# Patient Record
Sex: Male | Born: 2000 | Race: White | Hispanic: No | Marital: Single | Smoking: Never smoker
Health system: Southern US, Community
[De-identification: ages and names within clinical notes are randomized; demographics above are authoritative.]

## PROBLEM LIST (undated history)

## (undated) DIAGNOSIS — F419 Anxiety disorder, unspecified: Secondary | ICD-10-CM

---
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
## 2020-09-10 ENCOUNTER — Encounter: Payer: Self-pay | Admitting: *Deleted

## 2020-09-10 ENCOUNTER — Emergency Department
Admission: EM | Admit: 2020-09-10 | Discharge: 2020-09-10 | Disposition: A | Discharge status: Home / Self Care | Payer: Federal, State, Local not specified - PPO | Attending: Emergency Medicine | Admitting: Emergency Medicine

## 2020-09-10 ENCOUNTER — Emergency Department: Payer: Federal, State, Local not specified - PPO

## 2020-09-10 DIAGNOSIS — R0789 Other chest pain: Secondary | ICD-10-CM | POA: Insufficient documentation

## 2020-09-10 DIAGNOSIS — F419 Anxiety disorder, unspecified: Secondary | ICD-10-CM | POA: Insufficient documentation

## 2020-09-10 DIAGNOSIS — F429 Obsessive-compulsive disorder, unspecified: Secondary | ICD-10-CM | POA: Diagnosis not present

## 2020-09-10 DIAGNOSIS — R079 Chest pain, unspecified: Secondary | ICD-10-CM | POA: Diagnosis present

## 2020-09-10 LAB — COMPREHENSIVE METABOLIC PANEL
ALT: 44 U/L (ref 0–44)
AST: 28 U/L (ref 15–41)
Albumin: 4.4 g/dL (ref 3.5–5.0)
Alkaline Phosphatase: 57 U/L (ref 38–126)
Anion gap: 11 (ref 5–15)
BUN: 19 mg/dL (ref 6–20)
CO2: 26 mmol/L (ref 22–32)
Calcium: 9.9 mg/dL (ref 8.9–10.3)
Chloride: 102 mmol/L (ref 98–111)
Creatinine, Ser: 0.76 mg/dL (ref 0.61–1.24)
GFR, Estimated: >60 mL/min (ref >60)
Glucose, Bld: 87 mg/dL (ref 70–99)
Potassium: 3.9 mmol/L (ref 3.5–5.1)
Sodium: 139 mmol/L (ref 135–145)
Total Bilirubin: 0.9 mg/dL (ref 0.3–1.2)
Total Protein: 7.7 g/dL (ref 6.5–8.1)

## 2020-09-10 LAB — CBC WITH DIFFERENTIAL/PLATELET
Abs Immature Granulocytes: 0.02 10*3/uL (ref 0.00–0.07)
Basophils Absolute: 0 10*3/uL (ref 0.0–0.1)
Basophils Relative: 0 %
Eosinophils Absolute: 0.5 10*3/uL (ref 0.0–0.5)
Eosinophils Relative: 5 %
HCT: 46.6 % (ref 39.0–52.0)
Hemoglobin: 15.3 g/dL (ref 13.0–17.0)
Immature Granulocytes: 0 %
Lymphocytes Relative: 32 %
Lymphs Abs: 3.2 10*3/uL (ref 0.7–4.0)
MCH: 28.7 pg (ref 26.0–34.0)
MCHC: 32.8 g/dL (ref 30.0–36.0)
MCV: 87.3 fL (ref 80.0–100.0)
Monocytes Absolute: 0.5 10*3/uL (ref 0.1–1.0)
Monocytes Relative: 5 %
Neutro Abs: 5.6 10*3/uL (ref 1.7–7.7)
Neutrophils Relative %: 58 %
Platelets: 258 10*3/uL (ref 150–400)
RBC: 5.34 MIL/uL (ref 4.22–5.81)
RDW: 12.4 % (ref 11.5–15.5)
WBC: 9.8 10*3/uL (ref 4.0–10.5)
nRBC: 0 % (ref 0.0–0.2)

## 2020-09-10 LAB — TROPONIN I (HIGH SENSITIVITY): Troponin I (High Sensitivity): 4 ng/L (ref <18)

## 2020-09-10 LAB — TSH: TSH: 1.855 u[IU]/mL (ref 0.350–4.500)

## 2020-09-10 MED ORDER — ALPRAZOLAM 0.5 MG PO TABS: 0.5000 mg | ORAL_TABLET | Freq: Three times a day (TID) | ORAL | 0 refills | Status: AC | PRN

## 2020-09-10 MED ORDER — FLUOXETINE HCL 10 MG PO CAPS
10.0000 mg | ORAL_CAPSULE | Freq: Every day | ORAL | 2 refills | Status: AC
Start: 2020-09-10 — End: 2021-09-10

## 2020-09-10 NOTE — ED Triage Notes (Signed)
Patient states that he has been experiencing left sided chest pain during stressful events with sweating and winded/vertigo sensation.   Started 2 weeks ago, states has been happening intermittently. Once a day for last week.

## 2020-09-10 NOTE — ED Provider Notes (Signed)
[facility] Emergency Department Provider Note  ____________________________________________   Event Date/Time   First MD Initiated Contact with Patient 09/10/20 1836     (approximate)  I have reviewed the triage vital signs and the nursing notes.   HISTORY  Chief Complaint Anxiety and Chest Pain    HPI Hayden Peters is a 20 y.o. male presents emergency department complaining of anxiety and chest pain for 2 weeks.  Patient states that happens intermittently and radiates down the left arm.  We will also become dizzy during these episodes.  No history of heart disease.  Non-smoker.  No history of COVID.  No COVID vaccines.  Patient's mother states that she has anxiety and seems to be the same type symptoms.  She states that his grandmother had MIs in her mid 30s.  He has never had high cholesterol per the mother.    History reviewed. No pertinent past medical history.  There are no problems to display for this patient.   History reviewed. No pertinent surgical history.  Prior to Admission medications   Medication Sig Start Date End Date Taking? Authorizing Provider  ALPRAZolam Prudy Feeler) 0.5 MG tablet Take 1 tablet (0.5 mg total) by mouth 3 (three) times daily as needed for sleep or anxiety. 09/10/20 09/10/21 Yes Vora Clover, Roselyn Bering, PA-C  FLUoxetine (PROZAC) 10 MG capsule Take 1 capsule (10 mg total) by mouth daily. 09/10/20 09/10/21 Yes Symia Herdt, Roselyn Bering, PA-C    Allergies Sulfa antibiotics  History reviewed. No pertinent family history.  Social History Social History   Tobacco Use  . Smoking status: Never Smoker  . Smokeless tobacco: Never Used  Substance Use Topics  . Alcohol use: No  . Drug use: Never    Review of Systems  Constitutional: No fever/chills Eyes: No visual changes. ENT: No sore throat. Respiratory: Denies cough Cardiovascular: Plus chest pain Gastrointestinal: Denies abdominal pain Genitourinary: Negative for  dysuria. Musculoskeletal: Negative for back pain. Skin: Negative for rash. Psychiatric: no mood changes,     ____________________________________________   PHYSICAL EXAM:  VITAL SIGNS: ED Triage Vitals [09/10/20 1542]  Enc Vitals Group     BP      Pulse      Resp      Temp      Temp src      SpO2      Weight      Height      Head Circumference      Peak Flow      Pain Score 2     Pain Loc      Pain Edu?      Excl. in GC?     Constitutional: Alert and oriented. Well appearing and in no acute distress. Eyes: Conjunctivae are normal.  Head: Atraumatic. Nose: No congestion/rhinnorhea. Mouth/Throat: Mucous membranes are moist.  Neck:  supple no lymphadenopathy noted Cardiovascular: Normal rate, regular rhythm. Heart sounds are normal Respiratory: Normal respiratory effort.  No retractions, lungs c t a  GU: deferred Musculoskeletal: FROM all extremities, warm and well perfused Neurologic:  Normal speech and language.  Skin:  Skin is warm, dry and intact. No rash noted. Psychiatric: Mood and affect are normal. Speech and behavior are normal.  ____________________________________________   LABS (all labs ordered are listed, but only abnormal results are displayed)  Labs Reviewed  COMPREHENSIVE METABOLIC PANEL  CBC WITH DIFFERENTIAL/PLATELET  TSH  TROPONIN I (HIGH SENSITIVITY)   ____________________________________________   ____________________________________________  RADIOLOGY  Chest x-ray  ____________________________________________  PROCEDURES  Procedure(s) performed: No  Procedures    ____________________________________________   INITIAL IMPRESSION / ASSESSMENT AND PLAN / ED COURSE  Pertinent labs & imaging results that were available during my care of the patient were reviewed by me and considered in my medical decision making (see chart for details).   The patient is 20 year old male presents with concerns of anxiety versus heart  disease.  See HPI.  Physical exam shows patient to appear stable.  Vitals normal.  Due to the presentation however I do feel that we need to evaluate heart enzymes.  DDx: Myocarditis, angina, anxiety  CBC, metabolic panel, troponin EKG shows sinus arrhythmia, see physician read  Chest x-ray   Chest x-ray is normal, labs are normal and reassuring.  I did explain everything to the mother and the patient.  I do feel that with the sinus arrhythmia that is actually noticeable upon auscultation that he should follow-up with cardiology to have a Holter monitor.  He should also follow-up with psychiatry for the OCD/anxiety.  I did start him on Prozac 10 mg daily.  He was also given Xanax for as needed use for panic attack.  His mother states they will make appointments.  Follow-up as instructed.  He was also given strict cautions to return if worsening.  If he begins to feel suicidal or feels that he is worsening with the medication he should stop it immediately and come to the emergency department or his regular doctor.  Hayden Peters was evaluated in Emergency Department on 09/10/2020 for the symptoms described in the history of present illness. He was evaluated in the context of the global COVID-19 pandemic, which necessitated consideration that the patient might be at risk for infection with the SARS-CoV-2 virus that causes COVID-19. Institutional protocols and algorithms that pertain to the evaluation of patients at risk for COVID-19 are in a state of rapid change based on information released by regulatory bodies including the CDC and federal and state organizations. These policies and algorithms were followed during the patient's care in the ED.    As part of my medical decision making, I reviewed the following data within the electronic MEDICAL RECORD NUMBER History obtained from family, Nursing notes reviewed and incorporated, Labs reviewed , EKG interpreted see physician read, Old chart reviewed,  Radiograph reviewed , Notes from prior ED visits and [place] Controlled Substance Database  ____________________________________________   FINAL CLINICAL IMPRESSION(S) / ED DIAGNOSES  Final diagnoses:  Anxiety  Obsessive-compulsive disorder, unspecified type  Atypical chest pain      NEW MEDICATIONS STARTED DURING THIS VISIT:  New Prescriptions   ALPRAZOLAM (XANAX) 0.5 MG TABLET    Take 1 tablet (0.5 mg total) by mouth 3 (three) times daily as needed for sleep or anxiety.   FLUOXETINE (PROZAC) 10 MG CAPSULE    Take 1 capsule (10 mg total) by mouth daily.     Note:  This document was prepared using Dragon voice recognition software and may include unintentional dictation errors.    Faythe Ghee, PA-C 09/10/20 2025    Delton [place], MD 09/10/20 2055

## 2021-05-27 ENCOUNTER — Emergency Department: Payer: Federal, State, Local not specified - PPO

## 2021-05-27 ENCOUNTER — Encounter: Payer: Self-pay | Admitting: Emergency Medicine

## 2021-05-27 ENCOUNTER — Other Ambulatory Visit: Payer: Self-pay

## 2021-05-27 ENCOUNTER — Emergency Department
Admission: EM | Admit: 2021-05-27 | Discharge: 2021-05-27 | Disposition: A | Discharge status: Home / Self Care | Payer: Federal, State, Local not specified - PPO | Attending: Emergency Medicine | Admitting: Emergency Medicine

## 2021-05-27 DIAGNOSIS — R2241 Localized swelling, mass and lump, right lower limb: Secondary | ICD-10-CM | POA: Insufficient documentation

## 2021-05-27 HISTORY — DX: Anxiety disorder, unspecified: F41.9

## 2021-05-27 LAB — CBC WITH DIFFERENTIAL/PLATELET
Abs Immature Granulocytes: 0.02 10*3/uL (ref 0.00–0.07)
Basophils Absolute: 0 10*3/uL (ref 0.0–0.1)
Basophils Relative: 0 %
Eosinophils Absolute: 0.5 10*3/uL (ref 0.0–0.5)
Eosinophils Relative: 6 %
HCT: 47 % (ref 39.0–52.0)
Hemoglobin: 16.2 g/dL (ref 13.0–17.0)
Immature Granulocytes: 0 %
Lymphocytes Relative: 34 %
Lymphs Abs: 3.2 10*3/uL (ref 0.7–4.0)
MCH: 29.7 pg (ref 26.0–34.0)
MCHC: 34.5 g/dL (ref 30.0–36.0)
MCV: 86.1 fL (ref 80.0–100.0)
Monocytes Absolute: 0.6 10*3/uL (ref 0.1–1.0)
Monocytes Relative: 6 %
Neutro Abs: 5 10*3/uL (ref 1.7–7.7)
Neutrophils Relative %: 54 %
Platelets: 247 10*3/uL (ref 150–400)
RBC: 5.46 MIL/uL (ref 4.22–5.81)
RDW: 12.1 % (ref 11.5–15.5)
WBC: 9.3 10*3/uL (ref 4.0–10.5)
nRBC: 0 % (ref 0.0–0.2)

## 2021-05-27 LAB — COMPREHENSIVE METABOLIC PANEL
ALT: 58 U/L — ABNORMAL HIGH (ref 0–44)
AST: 30 U/L (ref 15–41)
Albumin: 4.3 g/dL (ref 3.5–5.0)
Alkaline Phosphatase: 59 U/L (ref 38–126)
Anion gap: 8 (ref 5–15)
BUN: 16 mg/dL (ref 6–20)
CO2: 29 mmol/L (ref 22–32)
Calcium: 9.7 mg/dL (ref 8.9–10.3)
Chloride: 99 mmol/L (ref 98–111)
Creatinine, Ser: 0.93 mg/dL (ref 0.61–1.24)
GFR, Estimated: >60 mL/min (ref >60)
Glucose, Bld: 93 mg/dL (ref 70–99)
Potassium: 4 mmol/L (ref 3.5–5.1)
Sodium: 136 mmol/L (ref 135–145)
Total Bilirubin: 0.9 mg/dL (ref 0.3–1.2)
Total Protein: 7.3 g/dL (ref 6.5–8.1)

## 2021-05-27 NOTE — ED Provider Notes (Signed)
[facility]-EMERGENCY DEPARTMENT  ____________________________________________  Time seen: Approximately 7:16 PM  I have reviewed the triage vital signs and the nursing notes.   HISTORY  Chief Complaint Leg Pain   Historian Patient     HPI Hayden Peters is a 20 y.o. male presents to the emergency department with concern for a soft tissue mass along the posterior aspect of the right thigh approximately 4 cm x 3 cm.  Patient reports that he first noticed mass when he went to sit down on the couch tonight.  He reports that area is tender to the touch, soft and movable.  No overlying erythema or fever at home.  He denies falls or mechanisms of trauma.  No new physical activity.  No weight loss, weight gain, night sweats or bony pain.  No personal history of malignancy.   Past Medical History:  Diagnosis Date   Anxiety      Immunizations up to date:  Yes.     Past Medical History:  Diagnosis Date   Anxiety     There are no problems to display for this patient.   No past surgical history on file.  Prior to Admission medications   Medication Sig Start Date End Date Taking? Authorizing Provider  ALPRAZolam Prudy Feeler) 0.5 MG tablet Take 1 tablet (0.5 mg total) by mouth 3 (three) times daily as needed for sleep or anxiety. 09/10/20 09/10/21  Fisher, Roselyn Bering, PA-C  FLUoxetine (PROZAC) 10 MG capsule Take 1 capsule (10 mg total) by mouth daily. 09/10/20 09/10/21  Faythe Ghee, PA-C    Allergies Sulfa antibiotics  No family history on file.  Social History Social History   Tobacco Use   Smoking status: Never   Smokeless tobacco: Never  Vaping Use   Vaping Use: Never used  Substance Use Topics   Alcohol use: No   Drug use: Never     Review of Systems  Constitutional: No fever/chills Eyes:  No discharge ENT: No upper respiratory complaints. Respiratory: no cough. No SOB/ use of accessory muscles to breath Gastrointestinal:   No nausea, no vomiting.  No diarrhea.  No  constipation. Musculoskeletal: Patient has right posterior thigh pain.  Skin: Negative for rash, abrasions, lacerations, ecchymosis.  ____________________________________________   PHYSICAL EXAM:  VITAL SIGNS: ED Triage Vitals  Enc Vitals Group     BP 05/27/21 1810 137/89     Pulse Rate 05/27/21 1810 82     Resp 05/27/21 1810 16     Temp 05/27/21 1810 98.5 F (36.9 C)     Temp Source 05/27/21 1810 Oral     SpO2 05/27/21 1810 96 %     Weight 05/27/21 1811 269 lb (122 kg)     Height 05/27/21 1811 6\' 1"  (1.854 m)     Head Circumference --      Peak Flow --      Pain Score 05/27/21 1811 2     Pain Loc --      Pain Edu? --      Excl. in GC? --      Constitutional: Alert and oriented. Well appearing and in no acute distress. Eyes: Conjunctivae are normal. PERRL. EOMI. Head: Atraumatic. ENT:      Nose: No congestion/rhinnorhea.      Mouth/Throat: Mucous membranes are moist.  Neck: No stridor.  No cervical spine tenderness to palpation. Cardiovascular: Normal rate, regular rhythm. Normal S1 and S2.  Good peripheral circulation. Respiratory: Normal respiratory effort without tachypnea or retractions. Lungs CTAB. Good air  entry to the bases with no decreased or absent breath sounds Gastrointestinal: Bowel sounds x 4 quadrants. Soft and nontender to palpation. No guarding or rigidity. No distention. Musculoskeletal: Full range of motion to all extremities. No obvious deformities noted.  Patient has a 4 cm x 3 cm right posterior thigh mass that is soft, movable and painful to palpation.  No overlying erythema. Neurologic:  Normal for age. No gross focal neurologic deficits are appreciated.  Skin:  Skin is warm, dry and intact. No rash noted. Psychiatric: Mood and affect are normal for age. Speech and behavior are normal.   ____________________________________________   LABS (all labs ordered are listed, but only abnormal results are displayed)  Labs Reviewed  COMPREHENSIVE  METABOLIC PANEL - Abnormal; Notable for the following components:      Result Value   ALT 58 (*)    All other components within normal limits  CBC WITH DIFFERENTIAL/PLATELET   ____________________________________________  EKG   ____________________________________________  RADIOLOGY Geraldo Pitter, personally viewed and evaluated these images (plain radiographs) as part of my medical decision making, as well as reviewing the written report by the radiologist.    US Venous Img Lower Unilateral Right  Result Date: 05/27/2021 CLINICAL DATA:  Right leg swelling EXAM: RIGHT LOWER EXTREMITY VENOUS DOPPLER ULTRASOUND TECHNIQUE: Gray-scale sonography with graded compression, as well as color Doppler and duplex ultrasound were performed to evaluate the lower extremity deep venous systems from the level of the common femoral vein and including the common femoral, femoral, profunda femoral, popliteal and calf veins including the posterior tibial, peroneal and gastrocnemius veins when visible. The superficial great saphenous vein was also interrogated. Spectral Doppler was utilized to evaluate flow at rest and with distal augmentation maneuvers in the common femoral, femoral and popliteal veins. COMPARISON:  None. FINDINGS: Contralateral Common Femoral Vein: Respiratory phasicity is normal and symmetric with the symptomatic side. No evidence of thrombus. Normal compressibility. Common Femoral Vein: No evidence of thrombus. Normal compressibility, respiratory phasicity and response to augmentation. Saphenofemoral Junction: No evidence of thrombus. Normal compressibility and flow on color Doppler imaging. Profunda Femoral Vein: No evidence of thrombus. Normal compressibility and flow on color Doppler imaging. Femoral Vein: No evidence of thrombus. Normal compressibility, respiratory phasicity and response to augmentation. Popliteal Vein: No evidence of thrombus. Normal compressibility, respiratory phasicity  and response to augmentation. Calf Veins: No evidence of thrombus. Normal compressibility and flow on color Doppler imaging. Superficial Great Saphenous Vein: No evidence of thrombus. Normal compressibility. Venous Reflux:  None. Other Findings:  None. IMPRESSION: No evidence of deep venous thrombosis. Electronically Signed   By: Alcide Clever M.D.   On: 05/27/2021 20:28   [place] RT LOWER EXTREM LTD SOFT TISSUE NON VASCULAR  Result Date: 05/27/2021 CLINICAL DATA:  Right thigh lump for 1 day EXAM: ULTRASOUND RIGHT LOWER EXTREMITY LIMITED TECHNIQUE: Ultrasound examination of the lower extremity soft tissues was performed in the area of clinical concern. COMPARISON:  None. FINDINGS: Scanning in the area of clinical concern shows a focal 1.1 x 0.7 x 1.3 cm hypoechoic focus likely representing a resolving hematoma. No other focal abnormality is noted. IMPRESSION: Changes consistent with a focal hematoma in the thigh. No drainable collection is noted. Electronically Signed   By: Alcide Clever M.D.   On: 05/27/2021 20:27    ____________________________________________    PROCEDURES  Procedure(s) performed:     Procedures     Medications - No data to display   ____________________________________________   INITIAL IMPRESSION /  ASSESSMENT AND PLAN / ED COURSE  Pertinent labs & imaging results that were available during my care of the patient were reviewed by me and considered in my medical decision making (see chart for details).      Assessment and Plan:  Hematoma 20 year old male presents to the emergency department with a soft tissue mass along the right posterior thigh near insertion for hamstring.  Vital signs are reassuring at triage.  On physical exam, mass was soft, tender to palpation and movable.  Venous ultrasound showed no signs of DVT.  Dedicated soft tissue ultrasound showed likely hematoma.  Recommended alternating ice and moist heat along with gentle massage to resolve  hematoma.  Recommended following up with primary care in 10 to 14 days if symptoms do not seem to be resolving.  Return precautions were given to return with new or worsening symptoms.  All patient questions were answered.    ____________________________________________  FINAL CLINICAL IMPRESSION(S) / ED DIAGNOSES  Final diagnoses:  Mass of hip region, right      NEW MEDICATIONS STARTED DURING THIS VISIT:  ED Discharge Orders     None           This chart was dictated using voice recognition software/Dragon. Despite best efforts to proofread, errors can occur which can change the meaning. Any change was purely unintentional.     Gasper Lloyd 05/27/21 2102    Phineas Semen, MD 05/27/21 2127

## 2021-05-27 NOTE — Discharge Instructions (Signed)
You can apply ice and warm compress to right thigh with gentle massage until symptoms resolve.

## 2021-05-27 NOTE — ED Triage Notes (Signed)
Pt in via POV, reports noticing a pain to posterior right leg upon standing up.  Pt noticed large nodule like area to right posterior upper leg at that time, states it just appeared.  No redness, bruising noted to site.  Ambulatory to triage without difficulty.

## 2021-05-27 NOTE — ED Provider Notes (Signed)
Emergency Medicine Provider Triage Evaluation Note  Hayden Peters, a 20 y.o. male  was evaluated in triage.  Pt complains of soft tissue swelling to the posterior right leg.  Patient reports a large cystic-like nodule to the posterior aspect of his proximal right thigh.  Noted to be area today, denies any previous history of it.  Denies any associated redness, tenderness, bruising, or lower extremity swelling.  Also denies any fevers, chills, or sweats.  Review of Systems  Positive: RLE nodule Negative: FCS, CP, SOB  Physical Exam  BP 137/89 (BP Location: Left Arm)   Pulse 82   Temp 98.5 F (36.9 C) (Oral)   Resp 16   Ht 6\' 1"  (1.854 m)   Wt 122 kg   SpO2 96%   BMI 35.49 kg/m  Gen:   Awake, no distress  NAD Resp:  Normal effort CTA MSK:   Moves extremities without difficulty Firm, cystic lesion to the posterior right thigh Other:  CVS: RRR  Medical Decision Making  Medically screening exam initiated at 6:50 PM.  Appropriate orders placed.  Ira Dougher was informed that the remainder of the evaluation will be completed by another provider, this initial triage assessment does not replace that evaluation, and the importance of remaining in the ED until their evaluation is complete.  Patient with ED evaluation of a cystic nodule noted to the RLE.    Tawanna Sat, PA-C 05/27/21 07/27/21    Carlis Stable, MD 05/27/21 2246

## 2023-01-25 IMAGING — US US EXTREM LOW VENOUS*R*
1 series · 13 of 24 positions shown · non-contrast
Comparison: None.

CLINICAL DATA: Right leg swelling



[Series 1: us venous img lower uni right (dvt) · portal-venous · 13 of 32 slices shown]
[im 1/32]
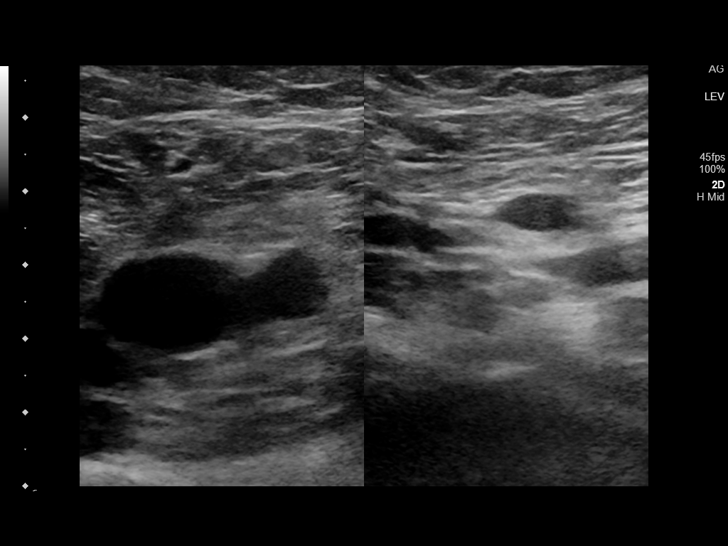
[im 3/32]
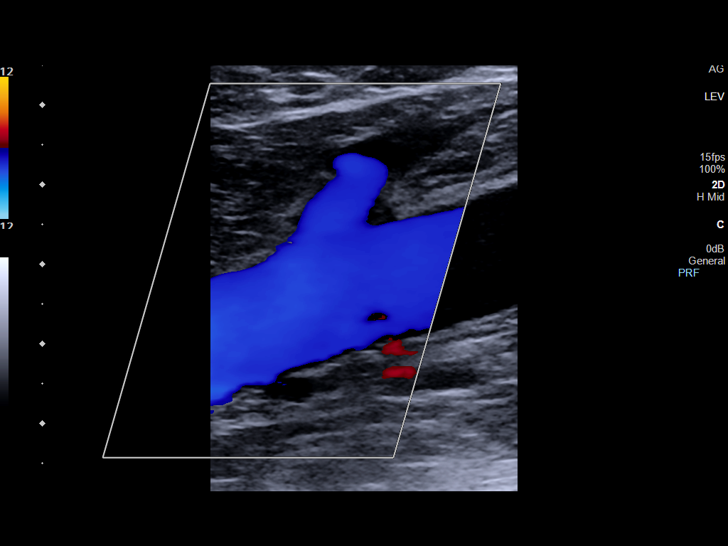
[im 6/32]
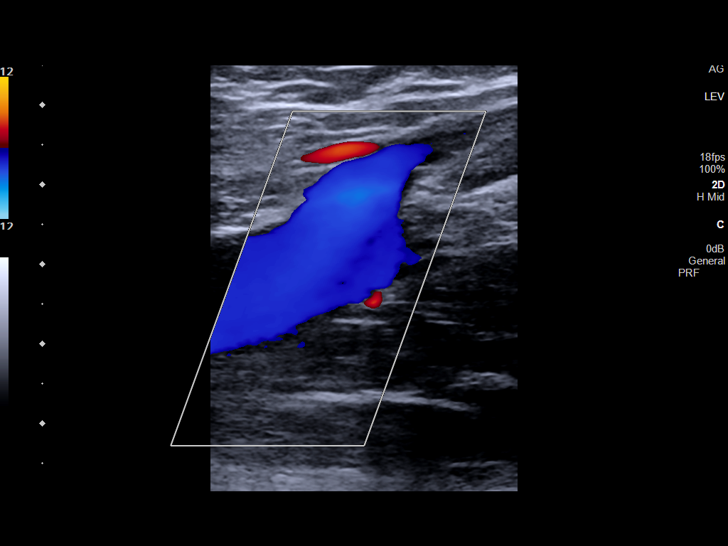
[im 9/32]
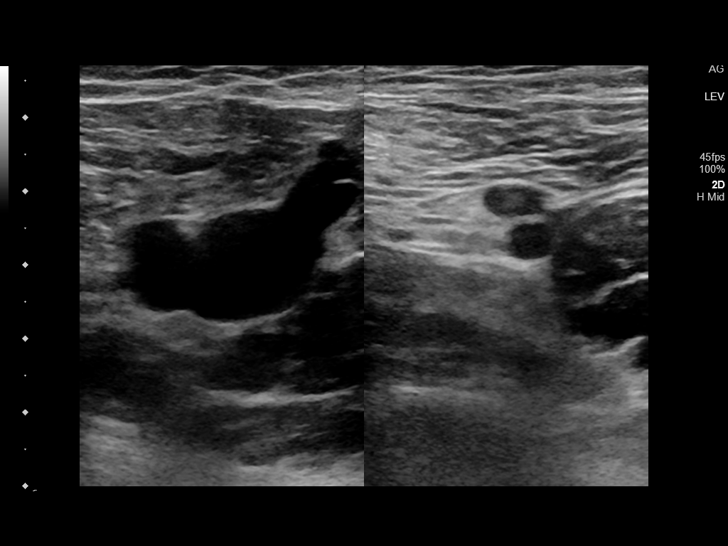
[im 11/32]
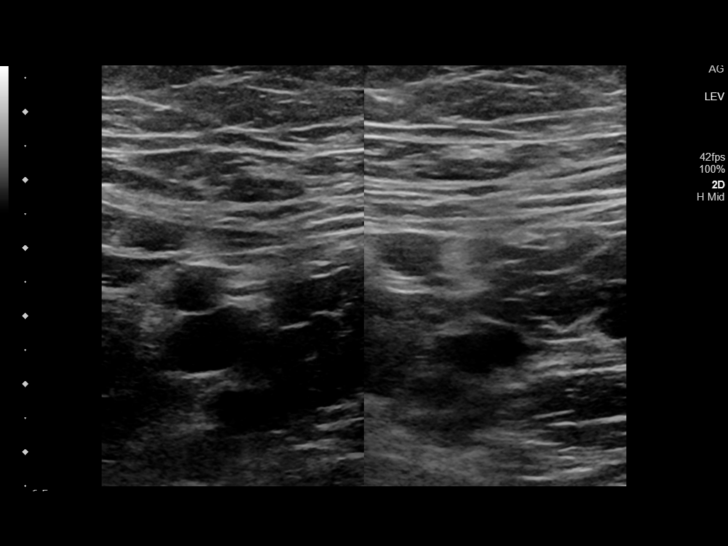
[im 14/32]
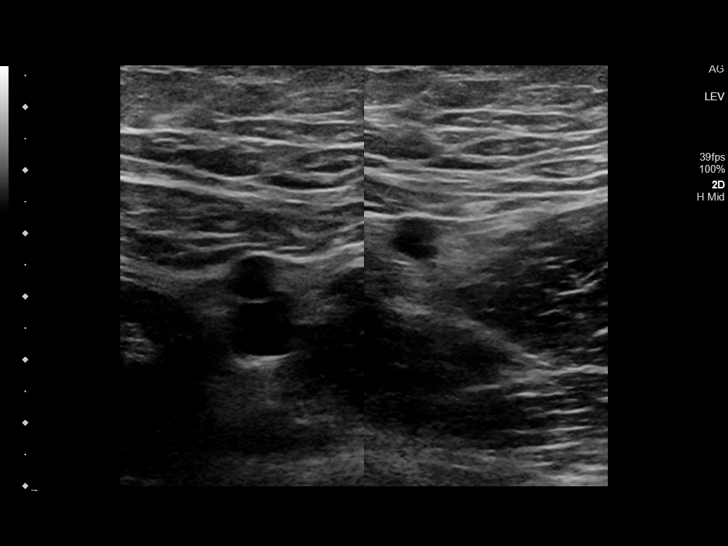
[im 17/32]
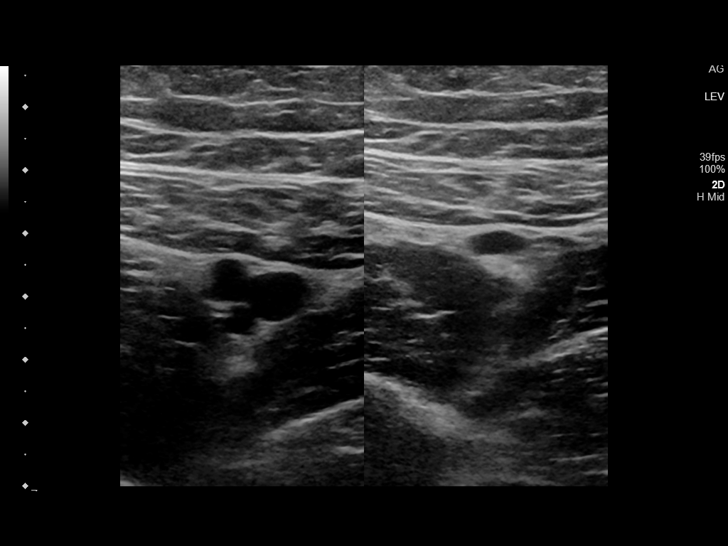
[im 18/32]
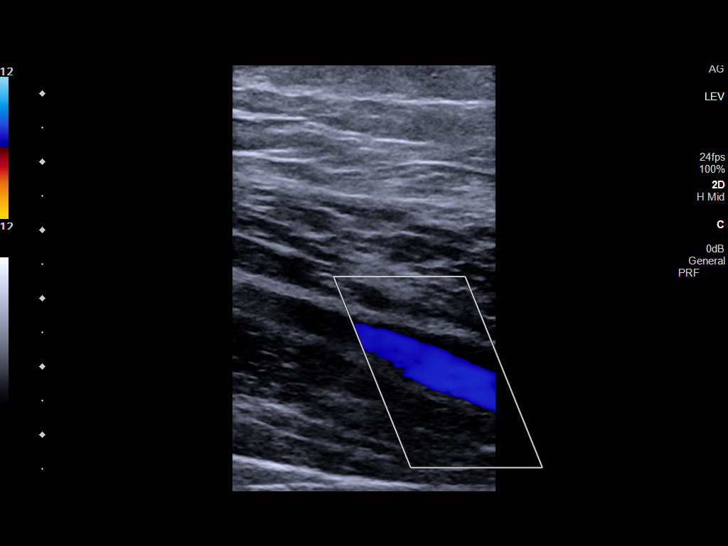
[im 21/32]
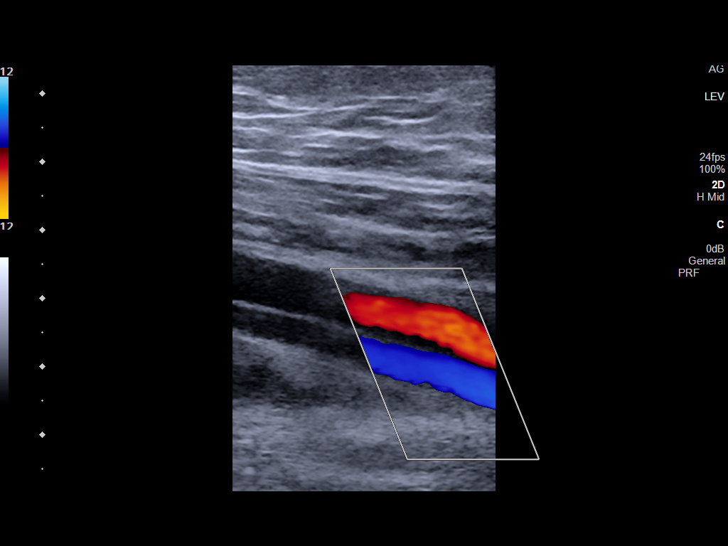
[im 23/32]
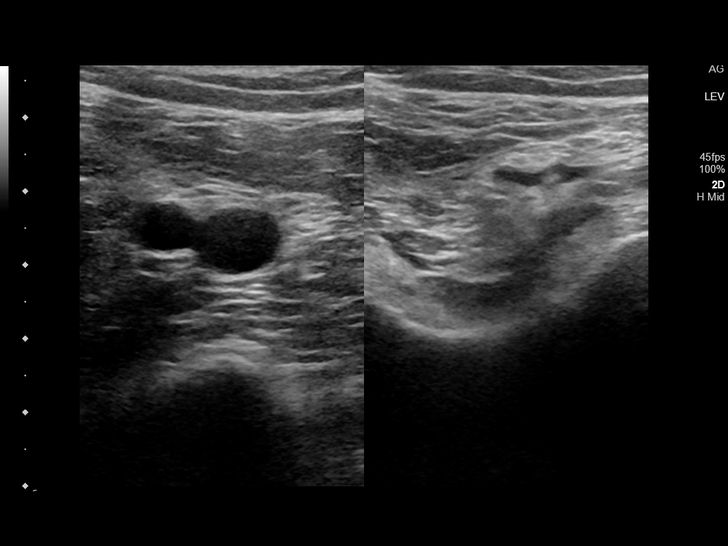
[im 26/32]
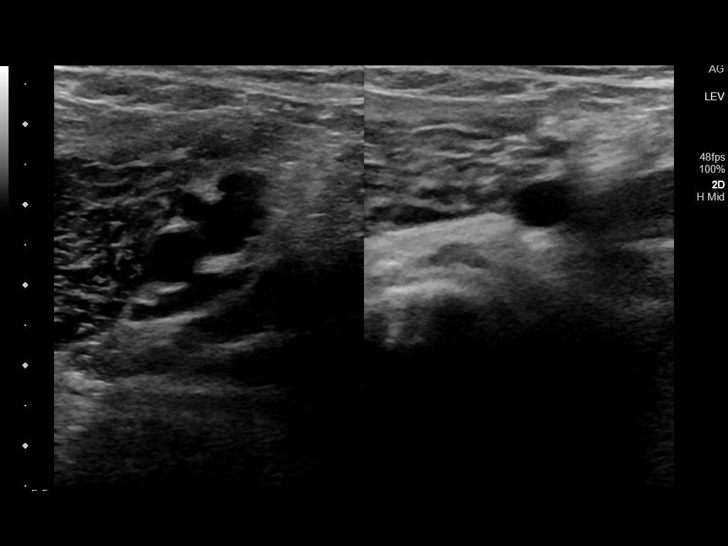
[im 29/32]
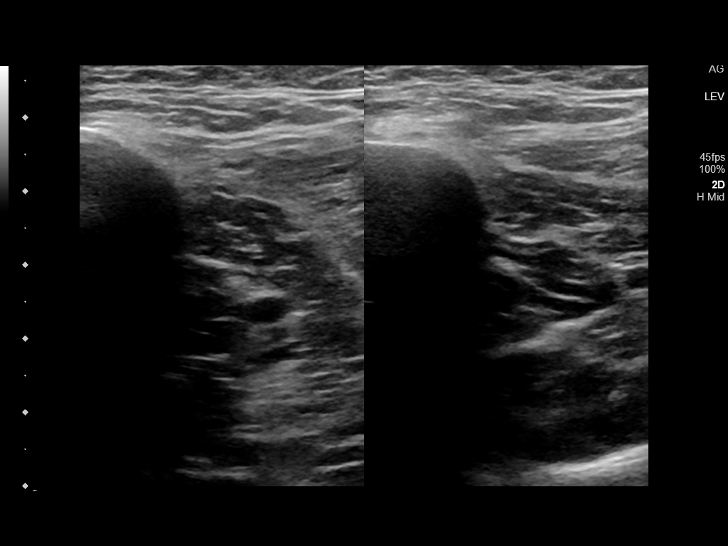
[im 32/32]
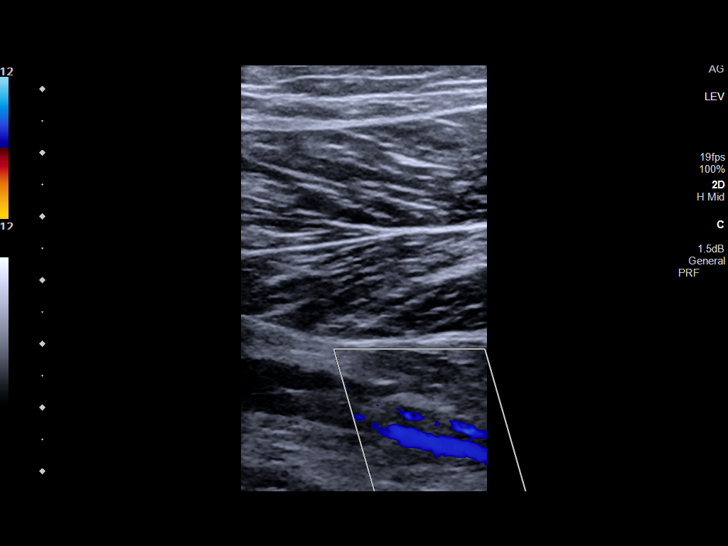

[13 of 24 positions shown; findings below may reference images not displayed]

FINDINGS: Contralateral Common Femoral Vein: Respiratory phasicity is normal
and symmetric with the symptomatic side. No evidence of thrombus.
Normal compressibility.

Common Femoral Vein: No evidence of thrombus. Normal
compressibility, respiratory phasicity and response to augmentation.

Saphenofemoral Junction: No evidence of thrombus. Normal
compressibility and flow on color Doppler imaging.

Profunda Femoral Vein: No evidence of thrombus. Normal
compressibility and flow on color Doppler imaging.

Femoral Vein: No evidence of thrombus. Normal compressibility,
respiratory phasicity and response to augmentation.

Popliteal Vein: No evidence of thrombus. Normal compressibility,
respiratory phasicity and response to augmentation.

Calf Veins: No evidence of thrombus. Normal compressibility and flow
on color Doppler imaging.

Superficial Great Saphenous Vein: No evidence of thrombus. Normal
compressibility.

Venous Reflux:  None.

Other Findings:  None.
IMPRESSION: No evidence of deep venous thrombosis.

## 2023-01-25 IMAGING — US US EXTREM LOW*R* LIMITED
1 series · 6 of 6 positions shown · non-contrast
Comparison: None.

CLINICAL DATA: Right thigh lump for 1 day

EXAM:
ULTRASOUND RIGHT LOWER EXTREMITY LIMITED
TECHNIQUE: Ultrasound examination of the lower extremity soft tissues was
performed in the area of clinical concern.

[Series 1: us soft tissue lower extremity limited right (non- · 6 of 6 slices shown]
[im 1/6]
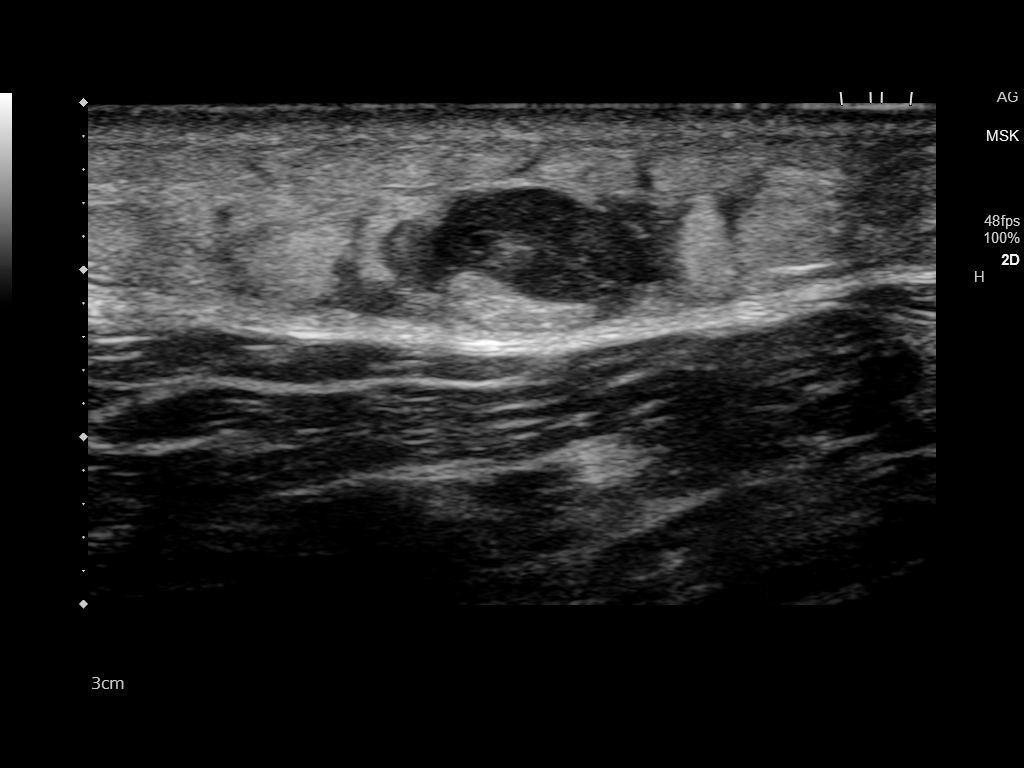
[im 2/6]
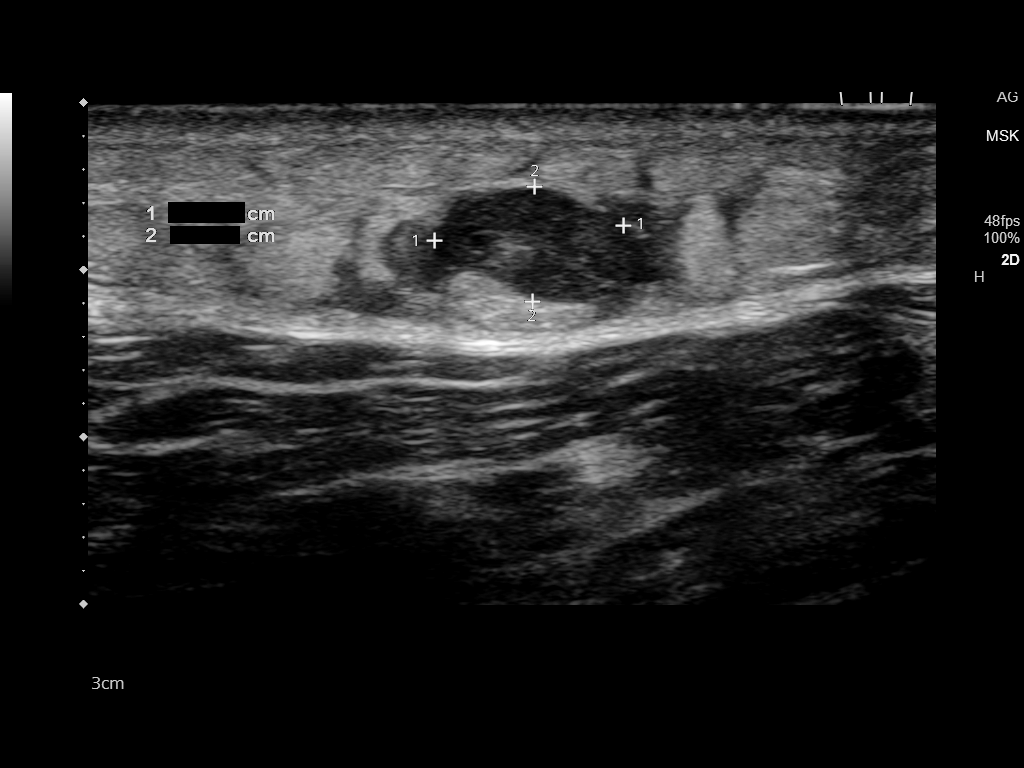
[im 3/6]
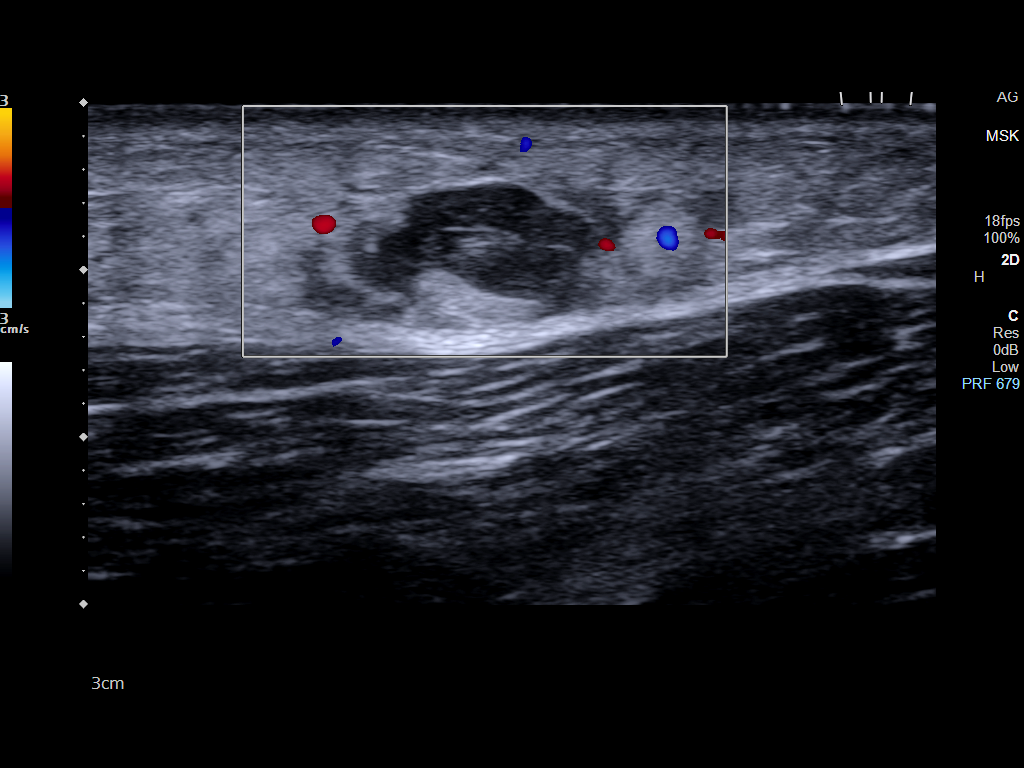
[im 4/6]
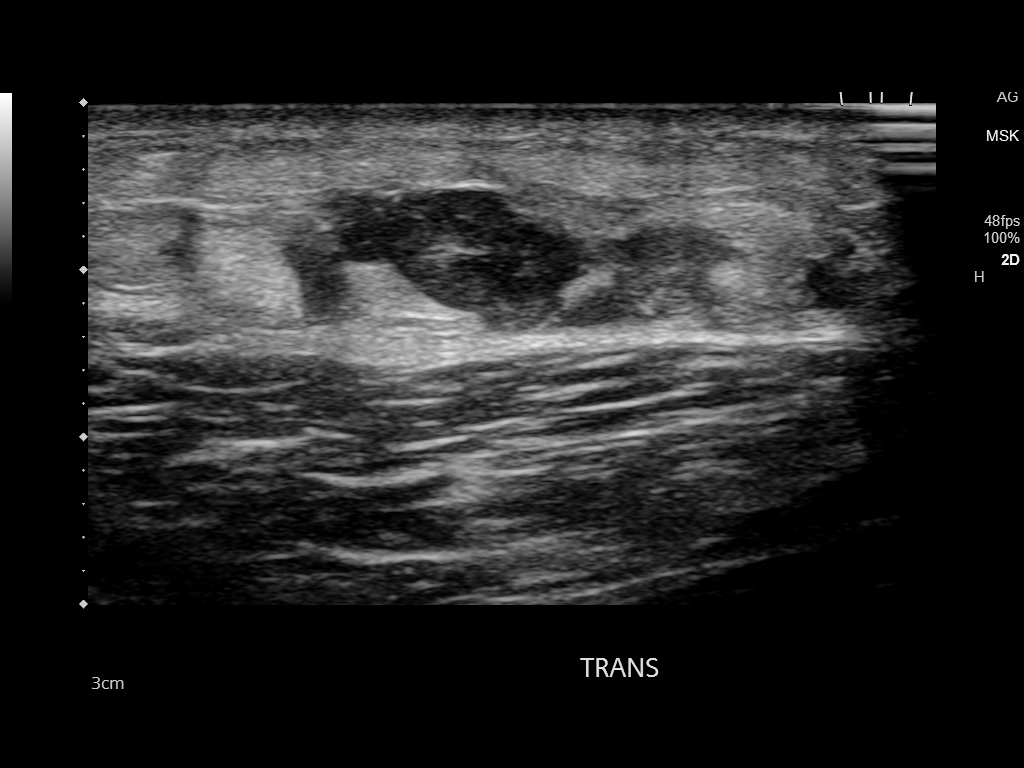
[im 5/6]
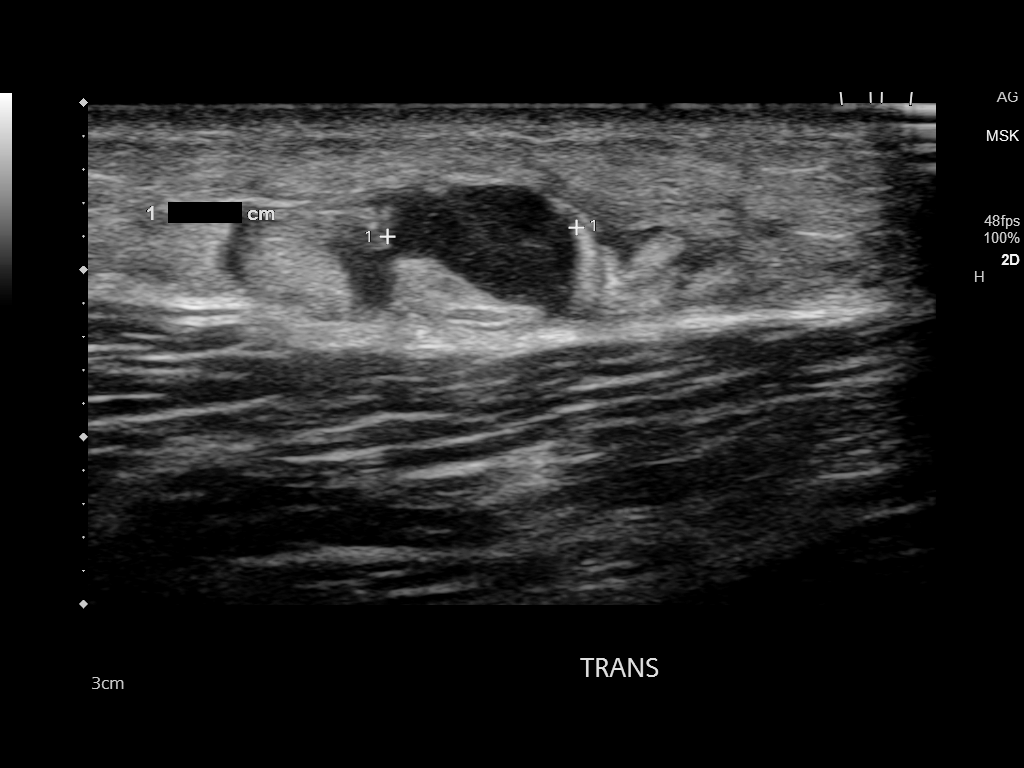
[im 6/6]
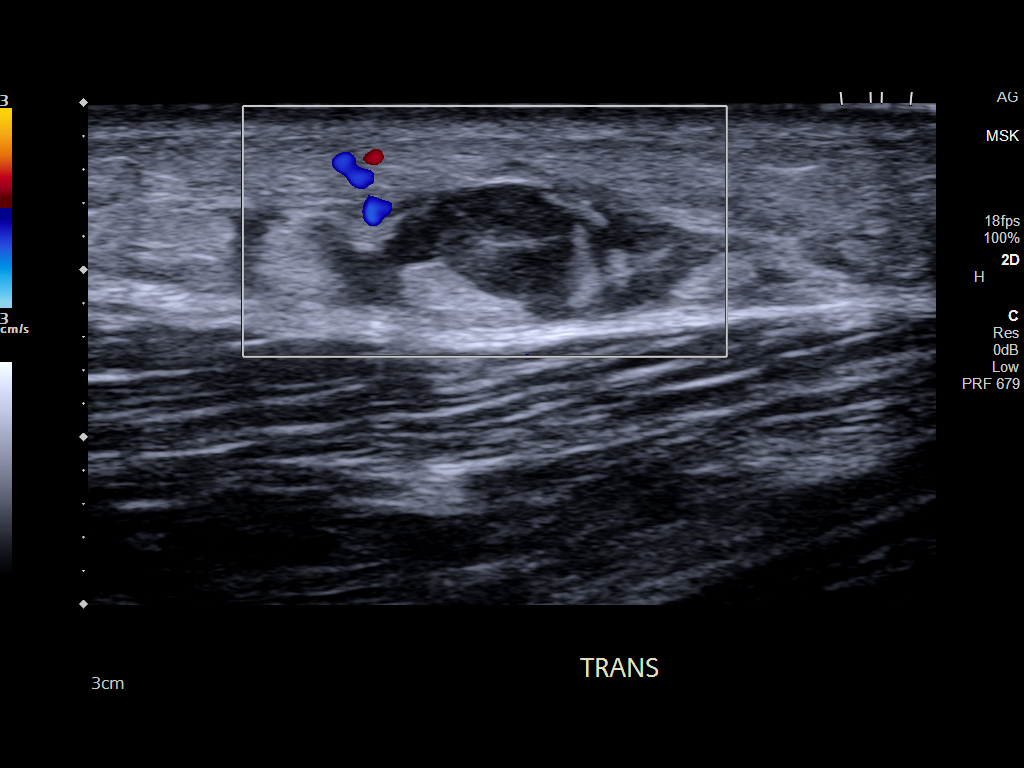

[6 of 6 positions shown; findings below may reference images not displayed]

FINDINGS: Scanning in the area of clinical concern shows a focal 1.1 x 0.7 x
1.3 cm hypoechoic focus likely representing a resolving hematoma. No
other focal abnormality is noted.
IMPRESSION: Changes consistent with a focal hematoma in the thigh. No drainable
collection is noted.
